# Patient Record
Sex: Female | Born: 1985 | Hispanic: Yes | Marital: Single | Smoking: Never smoker
Health system: Southern US, Community
[De-identification: ages and names within clinical notes are randomized; demographics above are authoritative.]

---

## 2017-04-10 ENCOUNTER — Encounter (HOSPITAL_COMMUNITY): Payer: Self-pay | Admitting: Emergency Medicine

## 2017-04-10 DIAGNOSIS — K0889 Other specified disorders of teeth and supporting structures: Secondary | ICD-10-CM | POA: Insufficient documentation

## 2017-04-10 MED ORDER — DIPHENHYDRAMINE HCL 25 MG PO CAPS
25.0000 mg | ORAL_CAPSULE | ORAL | Status: AC
  Administered 2017-04-10: 25 mg via ORAL
  Filled 2017-04-10: qty 1

## 2017-04-10 MED ORDER — PREDNISONE 20 MG PO TABS
60.0000 mg | ORAL_TABLET | Freq: Once | ORAL | Status: AC
  Administered 2017-04-10: 60 mg via ORAL
  Filled 2017-04-10: qty 3

## 2017-04-10 NOTE — ED Triage Notes (Signed)
Pt presents to ED for assessment of right sided dental pain, with throat involvement and difficulty breathing.  Airway swelling noted.  Pt seen by Greta DoomBowie, PA in triage and given PO meds for a possible allergic reaction.

## 2017-04-11 ENCOUNTER — Emergency Department (HOSPITAL_COMMUNITY): Payer: Self-pay

## 2017-04-11 ENCOUNTER — Emergency Department (HOSPITAL_COMMUNITY)
Admission: EM | Admit: 2017-04-11 | Discharge: 2017-04-11 | Disposition: A | Discharge status: Home / Self Care | Payer: Self-pay | Attending: Emergency Medicine | Admitting: Emergency Medicine

## 2017-04-11 ENCOUNTER — Encounter (HOSPITAL_COMMUNITY): Payer: Self-pay | Admitting: Emergency Medicine

## 2017-04-11 DIAGNOSIS — T7840XA Allergy, unspecified, initial encounter: Secondary | ICD-10-CM

## 2017-04-11 MED ORDER — FAMOTIDINE 20 MG PO TABS
20.0000 mg | ORAL_TABLET | Freq: Once | ORAL | Status: AC
  Administered 2017-04-11: 20 mg via ORAL
  Filled 2017-04-11: qty 1

## 2017-04-11 MED ORDER — DEXAMETHASONE SODIUM PHOSPHATE 10 MG/ML IJ SOLN
10.0000 mg | Freq: Once | INTRAMUSCULAR | Status: AC
  Administered 2017-04-11: 10 mg via INTRAMUSCULAR
  Filled 2017-04-11: qty 1

## 2017-04-11 MED ORDER — FAMOTIDINE 20 MG PO TABS: 20.0000 mg | ORAL_TABLET | Freq: Two times a day (BID) | ORAL | 0 refills | Status: AC

## 2017-04-11 MED ORDER — PREDNISONE 20 MG PO TABS: ORAL_TABLET | ORAL | 0 refills | Status: AC

## 2017-04-11 NOTE — ED Provider Notes (Signed)
[facility]-EMERGENCY DEPT Provider Note   CSN: [id number] Arrival date & time: 04/10/17  2137  By signing my name below, I, Deland PrettySherilynn Knight, attest that this documentation has been prepared under the direction and in the presence of Daxton Nydam, MD. Electronically Signed: Deland PrettySherilynn Knight, ED Scribe. 04/11/17. 1:49 AM.  History   Chief Complaint Chief Complaint  Patient presents with  . Dental Pain  . Shortness of Breath    The history is provided by the patient. No language interpreter was used.  Dental Pain   This is a new problem. The current episode started yesterday. The problem occurs constantly. The problem has not changed since onset.The treatment provided no relief.  Allergic Reaction  Presenting symptoms: swelling   Presenting symptoms: no difficulty swallowing and no rash   Severity:  Moderate Prior allergic episodes:  No prior episodes Context: medications   Context comment:  Fiorecet tylenol and amoxicillin Relieved by:  Antihistamines Worsened by:  Nothing Ineffective treatments:  None tried   HPI Comments: Savannah Pacheco is a 31 y.o. female who presents to the Emergency Department complaining of dental pain that began 3 days ago. She also complains of SOB and swollen throat that began a few hours ago today possibly due to an allergic reaction. The pt states that she took tylenol 500mg  and ibuprofen with inadequate relief. She also reports that she took some Firocet at 6:00pm prior to the onset of swelling. The pt states that she called her dentist where she was prescribed amoxicillin, and took one prior to being seen in the ED. The pt denies fever.  History reviewed. No pertinent past medical history.  There are no active problems to display for this patient.   History reviewed. No pertinent surgical history.  OB History    No data available       Home Medications    Prior to Admission medications   Not on File    Family History History reviewed. No  pertinent family history.  Social History Social History  Substance Use Topics  . Smoking status: Never Smoker  . Smokeless tobacco: Never Used  . Alcohol use No     Allergies   Patient has no allergy information on record.   Review of Systems Review of Systems  Constitutional: Negative for fever.  HENT: Positive for dental problem. Negative for trouble swallowing.   Respiratory: Negative for shortness of breath.   Cardiovascular: Negative for chest pain and leg swelling.  Skin: Negative for rash.  All other systems reviewed and are negative.    Physical Exam Updated Vital Signs BP 116/77   Pulse 61   Temp 98.2 F (36.8 C) (Oral)   Resp 18   SpO2 100%   Physical Exam  Constitutional: She is oriented to person, place, and time. She appears well-developed and well-nourished.  HENT:  Head: Normocephalic and atraumatic.  Mouth/Throat: Uvula is midline. No oropharyngeal exudate.  No parincula .Multiple cavities. No swelling of the lips tongue or uvula  Eyes: Pupils are equal, round, and reactive to light. Conjunctivae and EOM are normal.  Neck: Normal range of motion. No JVD present. No neck rigidity. Normal range of motion present.  No lymph adenopathy.  Cardiovascular: Normal rate, regular rhythm, normal heart sounds and intact distal pulses.   Pulses:      Radial pulses are 3+ on the right side, and 3+ on the left side.  Pulmonary/Chest: Effort normal and breath sounds normal. No stridor. She has no wheezes.  Abdominal: Soft.  Bowel sounds are normal. She exhibits no distension. There is no tenderness.  Musculoskeletal: Normal range of motion. She exhibits no edema.  Lymphadenopathy:    She has no cervical adenopathy.       Right cervical: No superficial cervical, no deep cervical and no posterior cervical adenopathy present.      Left cervical: No superficial cervical, no deep cervical and no posterior cervical adenopathy present.  Intact phonation  Neurological:  She is alert and oriented to person, place, and time.  Skin: Skin is warm and dry. Capillary refill takes less than 2 seconds. <1 second. No rash noted.  Psychiatric: She has a normal mood and affect.  Nursing note and vitals reviewed.    ED Treatments / Results   DIAGNOSTIC STUDIES: Oxygen Saturation is 100% on RA, normal by my interpretation.   COORDINATION OF CARE: 1:48 AM-Discussed next steps with pt. Pt verbalized understanding and is agreeable with the plan.   Labs (all labs ordered are listed, but only abnormal results are displayed)  No results found for this or any previous visit. Dg Neck Soft Tissue  Result Date: 04/11/2017 CLINICAL DATA:  Difficulty breathing and feels like throat is swelling after taking any medication. EXAM: NECK SOFT TISSUES - 1+ VIEW COMPARISON:  None. FINDINGS: There is no evidence of retropharyngeal soft tissue swelling or epiglottic enlargement. The cervical airway is unremarkable and no radio-opaque foreign body identified. IMPRESSION: Negative. Electronically Signed   By: Burman NievesWilliam  Stevens M.D.   On: 04/11/2017 02:19    Procedures Procedures (including critical care time)  Medications Ordered in ED  Medications  dexamethasone (DECADRON) injection 10 mg (not administered)  famotidine (PEPCID) tablet 20 mg (not administered)  diphenhydrAMINE (BENADRYL) capsule 25 mg (25 mg Oral Given 04/10/17 2200)  predniSONE (DELTASONE) tablet 60 mg (60 mg Oral Given 04/10/17 2200)     Here for evaluation of dental pain. Chronically poor dentition. On exam, there is no evidence of a drainable abscess. No trismus, glossal elevation, unilateral tonsillar swelling. No evidence of retropharyngeal or peritonsillar abscess or Ludwig angina. Discharged with outpatient dental resources as well as referral to on-call dentistry.  Overall, appears well, nontoxic and appropriate for discharge. Will need to stop amoxicillin tylenol and fioricet and report these as allergies.   Will need to be seen by PMD in am and call your dentist regarding reaction to meds.  Return for pain or difficulty swallowing, shortness of breath, swelling or the mouth neck or face, inability to open the mouth, fevers hives or any concerns.    Patient re-evaluated prior to dc, is hemodynamically stable, in no respiratory distress, and denies the feeling of throat closing. Pt has been advised to take OTC benadryl & return to the ED if they have a mod-severe allergic rxn (s/s including throat closing, difficulty breathing, swelling of lips face or tongue). Pt is to follow up with their PCP. Pt is agreeable with plan & verbalizes understanding.  Please follow-up right away with her family doctor. Please do not take any tylenol, Fioricet, or any additional medications until seen by your provider dentist.    I personally performed the services described in this documentation, which was scribed in my presence. The recorded information has been reviewed and is accurate.       Gorgeous Newlun, MD 04/11/17 [phone]

## 2018-01-17 IMAGING — DX DG NECK SOFT TISSUE
2 series · 2 of 2 positions shown · non-contrast
Comparison: None.

CLINICAL DATA: Difficulty breathing and feels like throat is
swelling after taking any medication.

EXAM:
NECK SOFT TISSUES - 1+ VIEW

[neck lat]
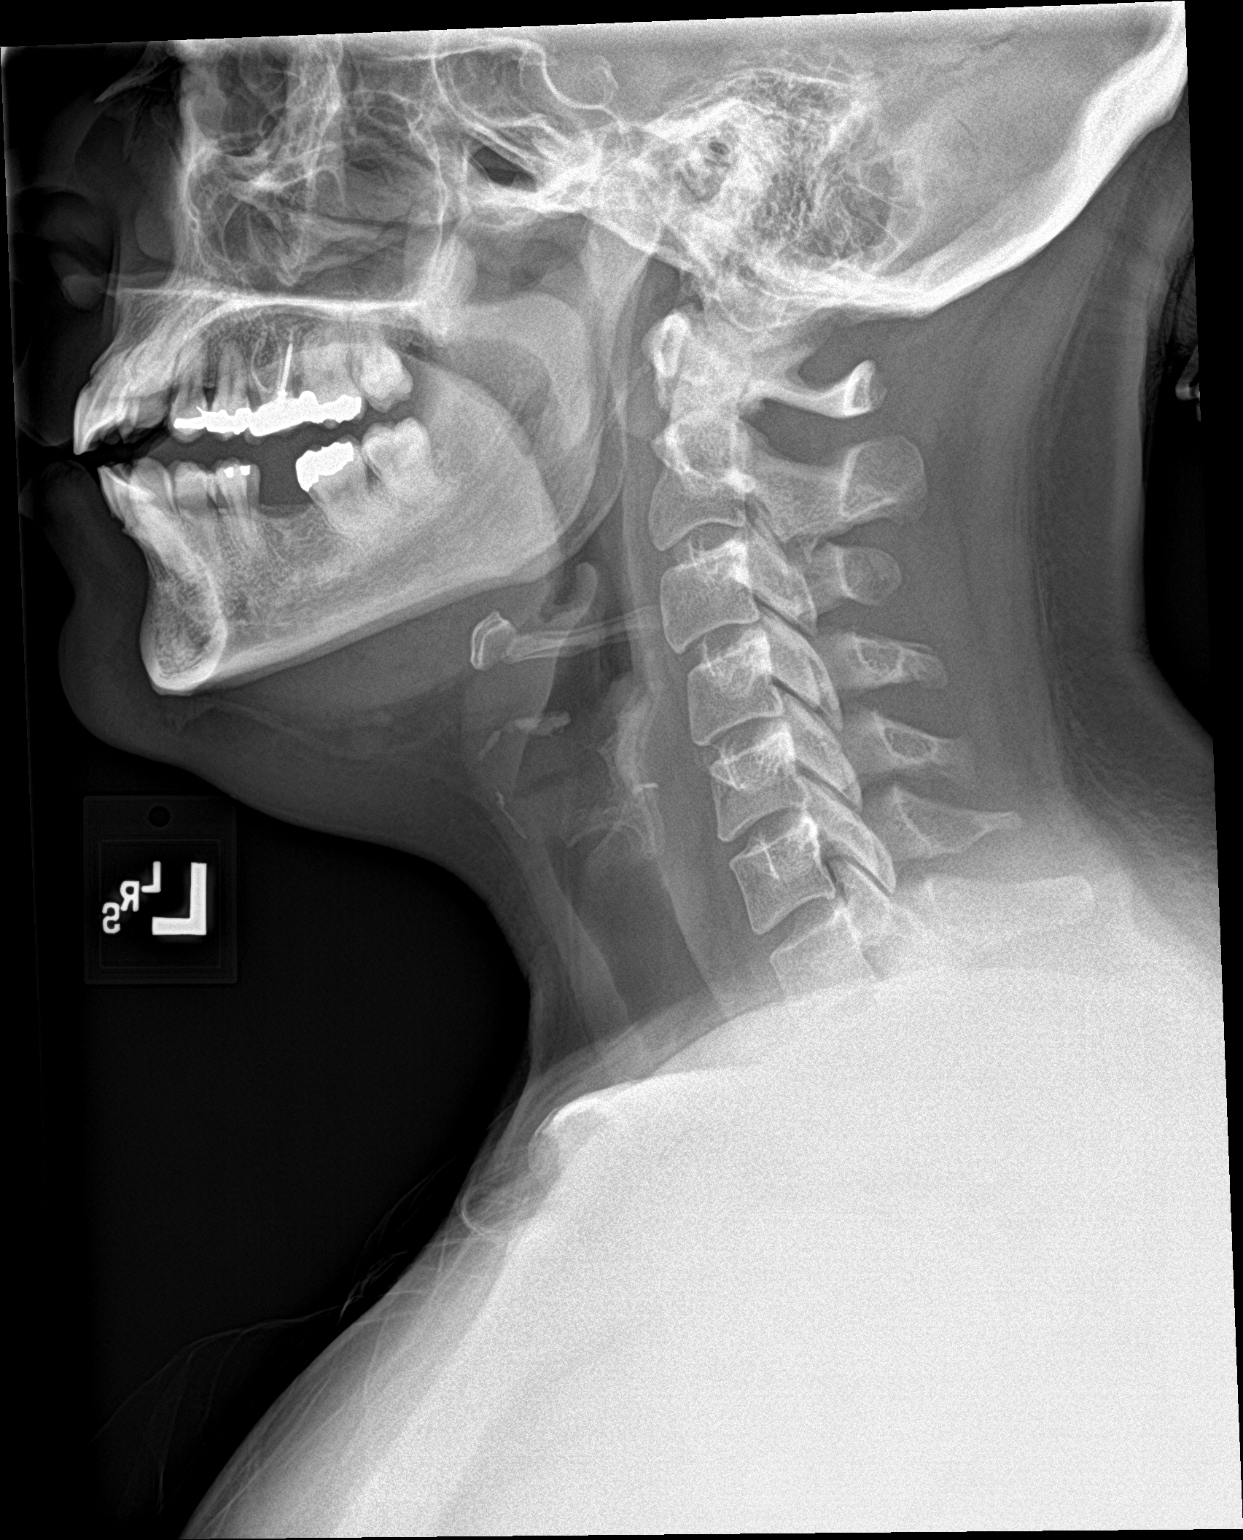

[neck ap]
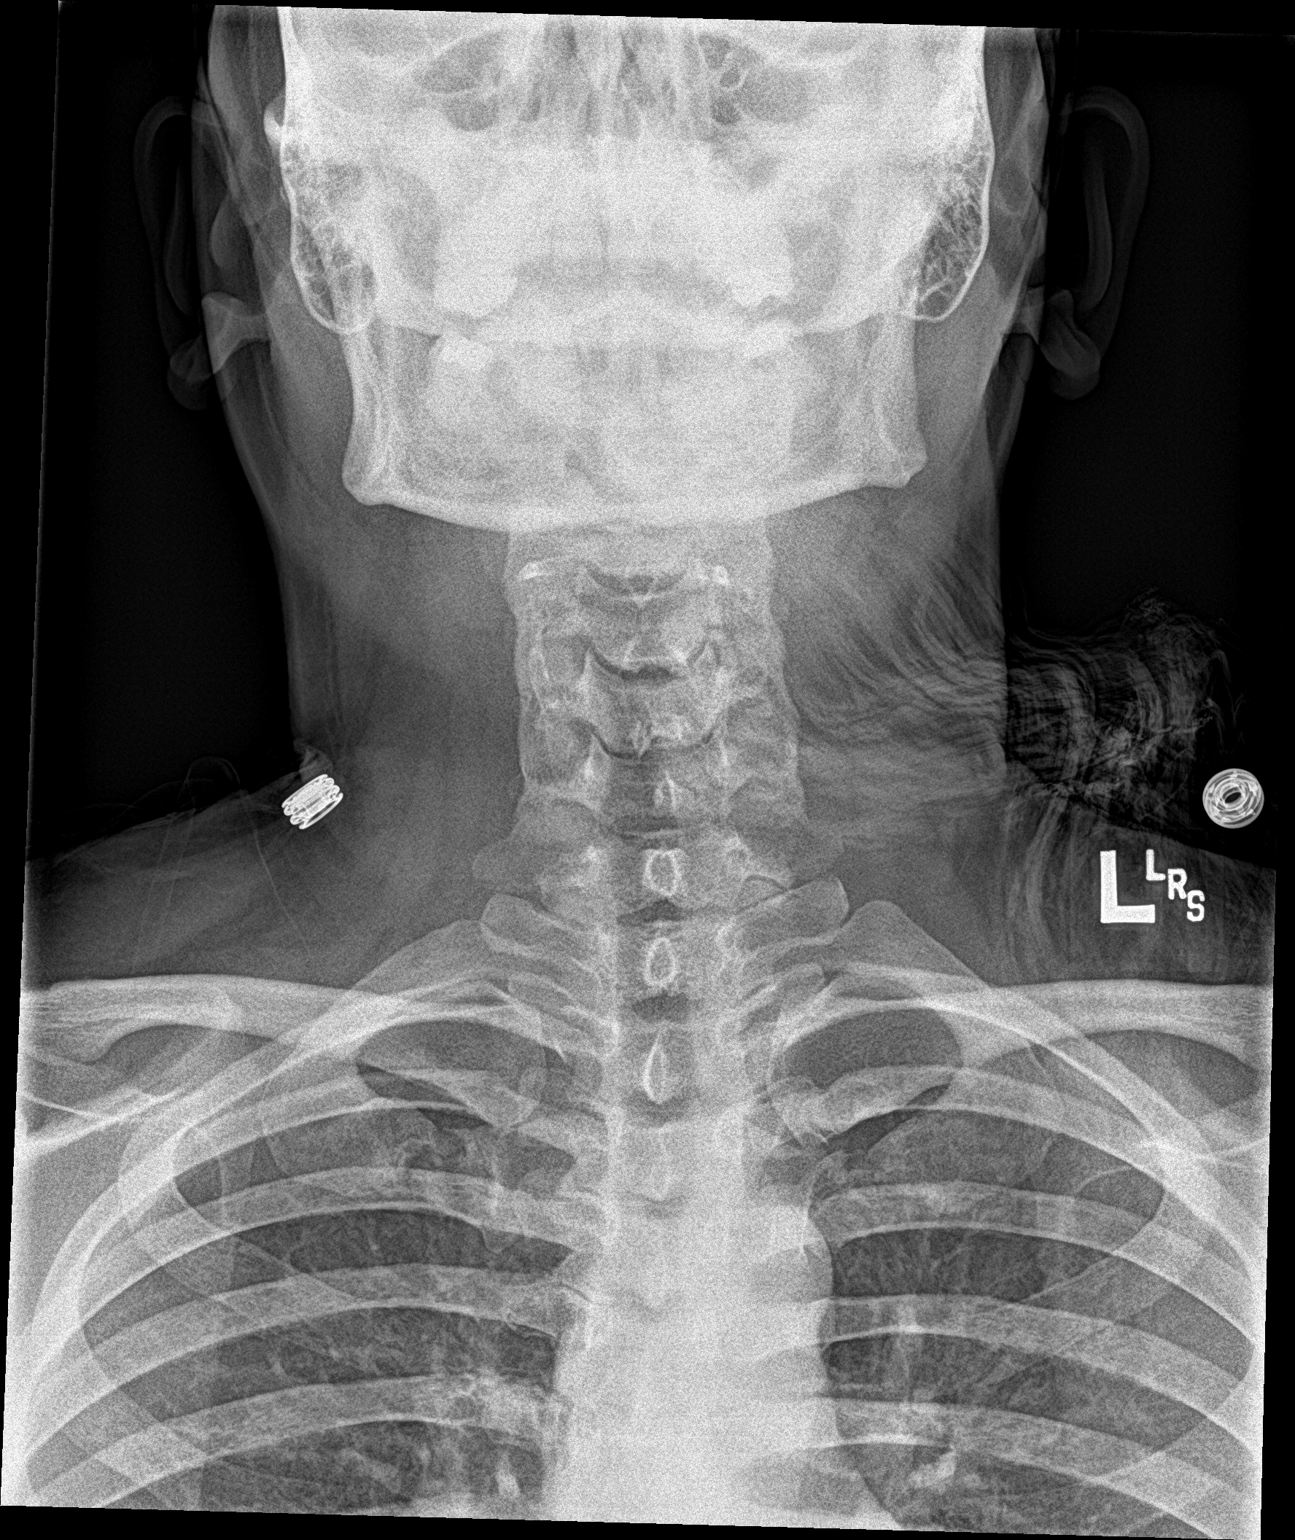

[2 of 2 positions shown; findings below may reference images not displayed]

FINDINGS: There is no evidence of retropharyngeal soft tissue swelling or
epiglottic enlargement. The cervical airway is unremarkable and no
radio-opaque foreign body identified.
IMPRESSION: Negative.
